# Patient Record
Sex: Female | Born: 1975 | Race: White | Hispanic: No | Marital: Married | Smoking: Never smoker
Health system: Southern US, Community
[De-identification: ages and names within clinical notes are randomized; demographics above are authoritative.]

## PROBLEM LIST (undated history)

## (undated) HISTORY — PX: CHOLECYSTECTOMY: SHX55

---

## 2000-11-21 ENCOUNTER — Other Ambulatory Visit: Admission: RE | Admit: 2000-11-21 | Discharge: 2000-11-21 | Payer: Self-pay | Admitting: Obstetrics and Gynecology

## 2002-07-01 HISTORY — PX: TONSILLECTOMY: SHX5217

## 2003-04-20 ENCOUNTER — Encounter (INDEPENDENT_AMBULATORY_CARE_PROVIDER_SITE_OTHER): Payer: Self-pay | Admitting: Specialist

## 2003-04-20 ENCOUNTER — Ambulatory Visit (HOSPITAL_BASED_OUTPATIENT_CLINIC_OR_DEPARTMENT_OTHER): Admission: RE | Admit: 2003-04-20 | Discharge: 2003-04-20 | Payer: Self-pay | Admitting: Otolaryngology

## 2003-04-20 ENCOUNTER — Ambulatory Visit (HOSPITAL_COMMUNITY): Admission: RE | Admit: 2003-04-20 | Discharge: 2003-04-20 | Payer: Self-pay | Admitting: Otolaryngology

## 2005-03-27 ENCOUNTER — Other Ambulatory Visit: Admission: RE | Admit: 2005-03-27 | Discharge: 2005-03-27 | Payer: Self-pay | Admitting: Obstetrics and Gynecology

## 2006-01-08 ENCOUNTER — Other Ambulatory Visit: Admission: RE | Admit: 2006-01-08 | Discharge: 2006-01-08 | Payer: Self-pay | Admitting: Obstetrics and Gynecology

## 2006-03-20 ENCOUNTER — Ambulatory Visit (HOSPITAL_COMMUNITY): Admission: RE | Admit: 2006-03-20 | Discharge: 2006-03-20 | Payer: Self-pay | Admitting: Obstetrics and Gynecology

## 2006-07-28 ENCOUNTER — Inpatient Hospital Stay (HOSPITAL_COMMUNITY): Admission: AD | Admit: 2006-07-28 | Discharge: 2006-07-30 | Payer: Self-pay | Admitting: Obstetrics and Gynecology

## 2006-09-09 ENCOUNTER — Other Ambulatory Visit: Admission: RE | Admit: 2006-09-09 | Discharge: 2006-09-09 | Payer: Self-pay | Admitting: Obstetrics and Gynecology

## 2007-07-22 ENCOUNTER — Ambulatory Visit (HOSPITAL_COMMUNITY): Admission: RE | Admit: 2007-07-22 | Discharge: 2007-07-22 | Payer: Self-pay | Admitting: Family Medicine

## 2009-01-23 ENCOUNTER — Ambulatory Visit (HOSPITAL_COMMUNITY): Admission: RE | Admit: 2009-01-23 | Discharge: 2009-01-23 | Payer: Self-pay | Admitting: Family Medicine

## 2010-11-16 NOTE — Op Note (Signed)
   NAMELear Ng NO.:  [id number]   MEDICAL RECORD NO.:  [id number]                   PATIENT TYPE:  AMB   LOCATION:  DSC                                  FACILITY:  [facility]   PHYSICIAN:  Suzanna Obey, M.D.                    DATE OF BIRTH:  05-14-1976   DATE OF PROCEDURE:  04/20/2003  DATE OF DISCHARGE:                                 OPERATIVE REPORT   PREOPERATIVE DIAGNOSIS:  Chronic tonsillitis.   POSTOPERATIVE DIAGNOSIS:  Chronic tonsillitis.   SURGICAL PROCEDURE:  Tonsillectomy.   SURGEON:  Suzanna Obey, M.D.   ANESTHESIA:  General endotracheal tube.   ESTIMATED BLOOD LOSS:  Less than 5 mL.   INDICATIONS:  This is a 35 year old who has had repetitive and chronic  problems with tonsillitis.  She is very miserable with this repetitive and  persistent problem.  She is ready to proceed with a tonsillectomy.  She was  informed of the risks and benefits of the procedure including bleeding,  infection, velopharyngeal insufficiency, change in the voice, chronic pain,  and risks of the anesthetic.  All questions are answered and consent was  obtained.   OPERATION:  The patient was taken to the operating room and placed in supine  position and after adequate general endotracheal tube anesthesia, was placed  into the Rose position and draped in the usual sterile manner.  The Crowe-  Davis mouth gag was inserted, retracted and suspended from the [facility] stand.  The left tonsil was begun, making a left anterior tonsillar pillar incision,  identifying the capsule of the tonsil and removing it with electrocautery  dissection; right tonsil was removed in the same fashion.  Hemostasis was  achieved with suction cautery.  The adenoids were checked; there was no  adenoid tissue of significance.  Hypopharynx, esophagus and stomach were  suctioned with the NG tube.  The Crowe-Davis was released and re-suspended  and there was hemostasis present in all  locations.  The patient was awakened  and brought to recovery in stable condition.  Counts correct.                                               Suzanna Obey, M.D.    Cordelia Pen  D:  04/20/2003  T:  04/20/2003  Job:  [id number]   cc:   Lorin Picket A. Gerda Diss, M.D.  [address], [address]  [place]  [place] [address]  Fax: [phone]

## 2010-11-16 NOTE — Discharge Summary (Signed)
[name], [name]              ACCOUNT NO.:  [id number]   MEDICAL RECORD NO.:  [id number]          PATIENT TYPE:  INP   LOCATION:  9102                          FACILITY:  [facility]   PHYSICIAN:  James A. Ashley Royalty, M.D.DATE OF BIRTH:  [birth year]   DATE OF ADMISSION:  07/28/2006  DATE OF DISCHARGE:  07/30/2006                               DISCHARGE SUMMARY   DISCHARGE DIAGNOSIS:  Intrauterine pregnancy at 67 weeks' gestation,  delivered   OPERATIONS AND PROCEDURES:  OB delivery.   CONSULTATIONS:  None.   DISCHARGE MEDICATIONS:  Percocet, Motrin 600 mg.   HISTORY AND PHYSICAL:  A 35 year old gravida 3, para 1-0-1-1 at 39+  weeks' gestation.  Prenatal care was complicated by group B strep  positive and glucose intolerance.  The patient requested induction  secondary to musculoskeletal discomfort.  Initial cervical examination  revealed the cervix to be 3-4 cm dilated, 75-80% effaced, 0-1+, station,  and vertex presentation.  For the remainder of the history and physical,  please see chart.   HOSPITAL COURSE:  The patient was admitted was admitted to [facility].  Admission laboratory studies were drawn.  She  was given Pitocin.  She went on to labor and deliver on July 28, 2006.  The infant was an 8 pounds 0 ounce female, Apgars 8 at one minute  9 at five minutes, sent to newborn nursery.  Delivery was accomplished  by Dr. Sylvester Harder over an intact perineum.  There were no  lacerations.  The patient's postpartum course was benign.  She was  discharged on the second postpartum day afebrile and in satisfactory  condition.   DISPOSITION:  The patient is to return to [facility] and  Obstetrics in 4-6 weeks for postpartum evaluation.      James A. Ashley Royalty, M.D.  Electronically Signed     JAM/MEDQ  D:  09/29/2006  T:  09/29/2006  Job:  [id number]

## 2011-05-20 IMAGING — CR DG ANKLE COMPLETE 3+V*R*
2 series · 2 of 2 positions shown · non-contrast
Comparison: None

CLINICAL DATA: Lateral pain following fall

RIGHT ANKLE - COMPLETE 3+ VIEW

[view not recorded (1 of 2)]
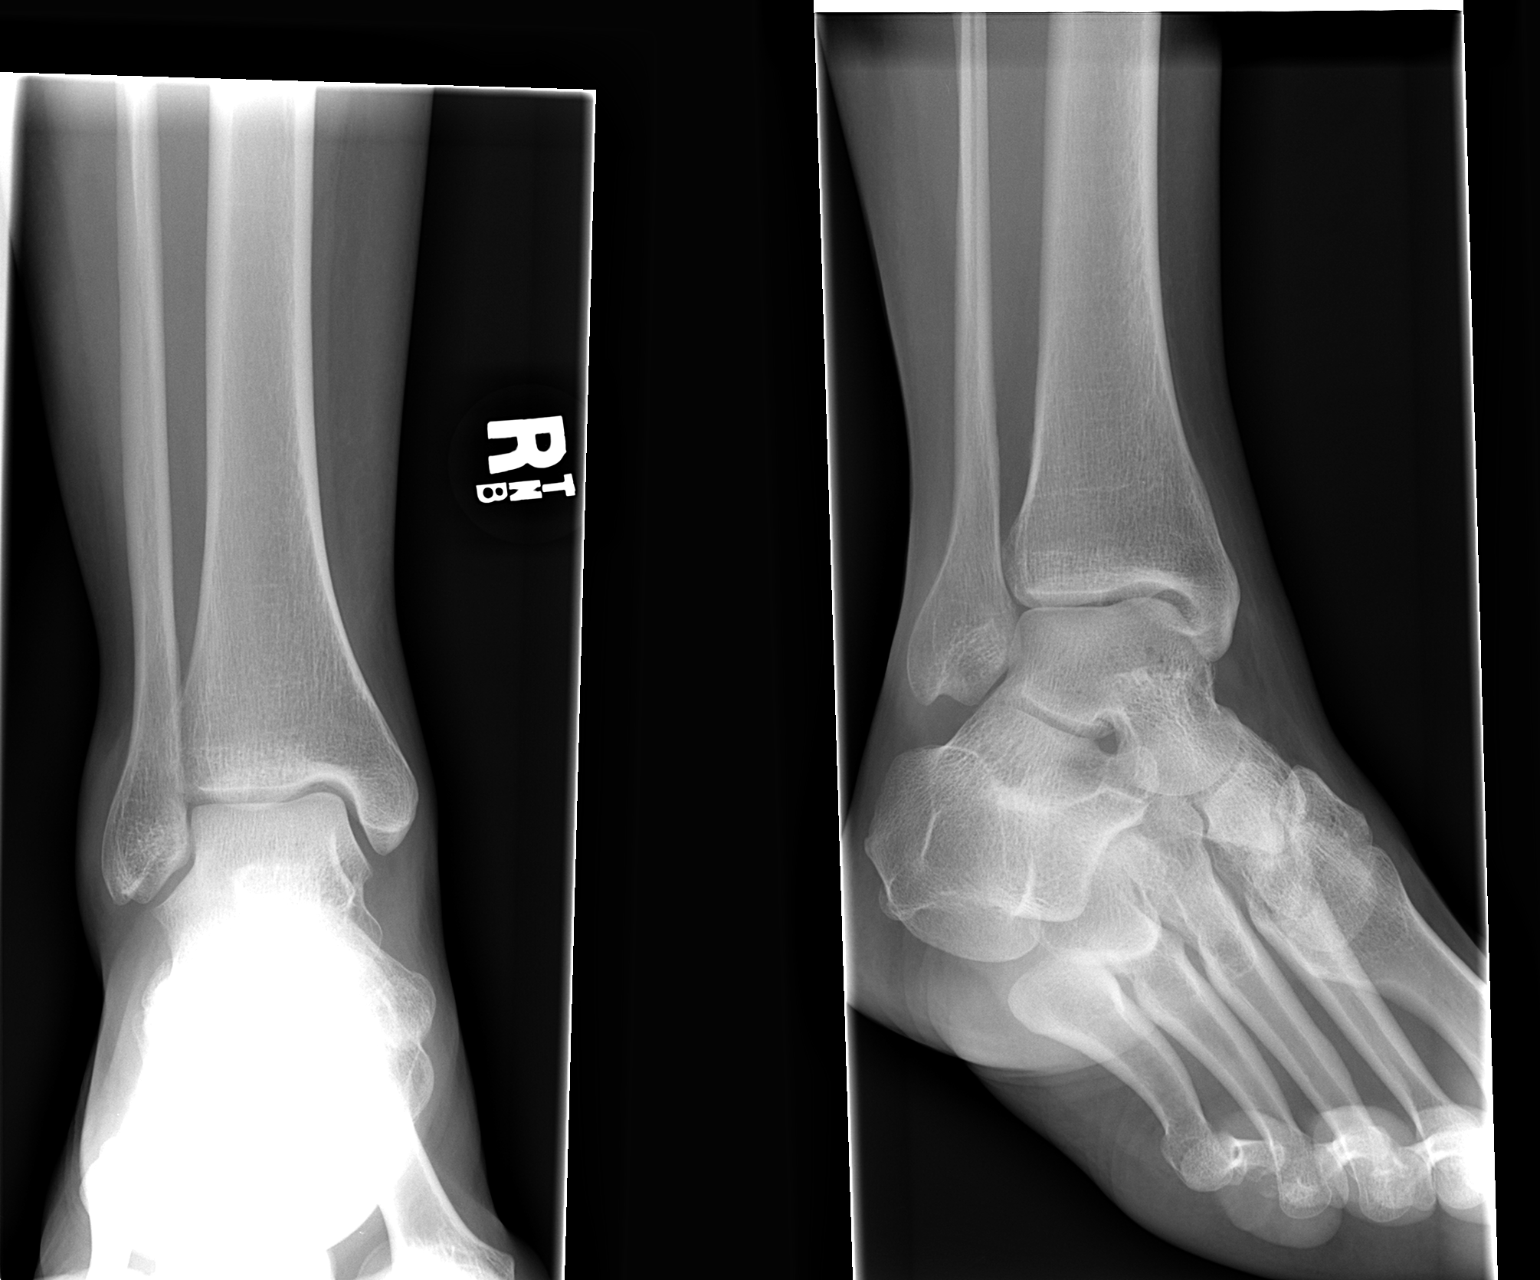

[view not recorded (2 of 2)]
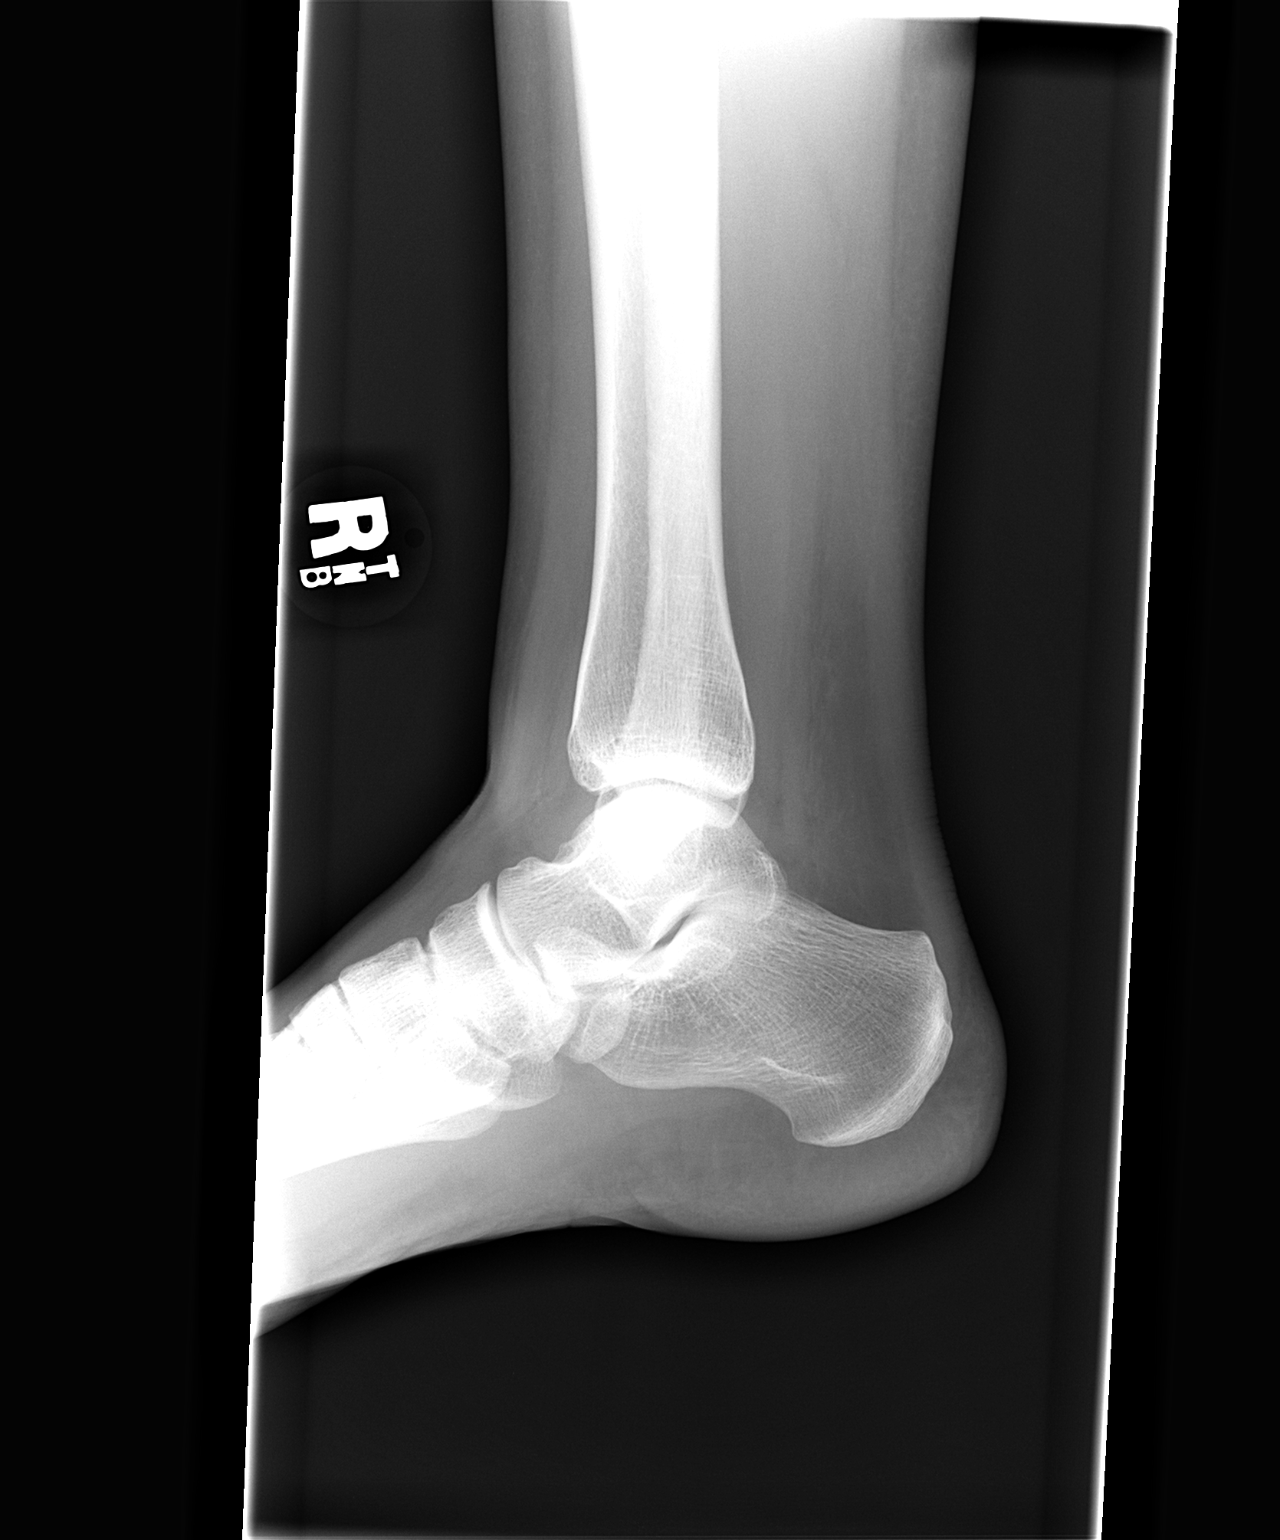

[2 of 2 positions shown; findings below may reference images not displayed]

FINDINGS: Mild lateral soft tissue swelling.  No fracture,
dislocation, or acute changes.
IMPRESSION: Mild lateral soft tissue swelling - otherwise unremarkable.

## 2012-09-24 ENCOUNTER — Other Ambulatory Visit: Payer: Self-pay | Admitting: Obstetrics and Gynecology

## 2013-07-20 ENCOUNTER — Telehealth: Payer: Self-pay | Admitting: Nurse Practitioner

## 2013-07-20 MED ORDER — OSELTAMIVIR PHOSPHATE 75 MG PO CAPS: 75.0000 mg | ORAL_CAPSULE | Freq: Two times a day (BID) | ORAL | Status: DC

## 2013-07-20 NOTE — Telephone Encounter (Signed)
Rx sent electronically to [facility]. Patient notified.

## 2013-07-20 NOTE — Telephone Encounter (Signed)
Patients daughter was just diagnosed with the flu at her pediatric office in [place]. Patient is currently having bad headache, fever and dry cough. Would like Tami-flu called in if possible. [facility] [place]

## 2013-07-20 NOTE — Telephone Encounter (Signed)
tamiflu 75 bid five d 

## 2013-10-20 ENCOUNTER — Other Ambulatory Visit: Payer: Self-pay | Admitting: Obstetrics and Gynecology

## 2014-11-04 ENCOUNTER — Other Ambulatory Visit (HOSPITAL_COMMUNITY): Payer: Self-pay | Admitting: Obstetrics and Gynecology

## 2014-11-07 LAB — CYTOLOGY - PAP

## 2015-12-11 ENCOUNTER — Other Ambulatory Visit: Payer: Self-pay | Admitting: Obstetrics & Gynecology

## 2015-12-12 LAB — CYTOLOGY - PAP

## 2016-01-03 ENCOUNTER — Other Ambulatory Visit: Payer: Self-pay | Admitting: Obstetrics & Gynecology

## 2016-10-07 DIAGNOSIS — Z6820 Body mass index (BMI) 20.0-20.9, adult: Secondary | ICD-10-CM | POA: Diagnosis not present

## 2016-12-06 DIAGNOSIS — X32XXXA Exposure to sunlight, initial encounter: Secondary | ICD-10-CM | POA: Diagnosis not present

## 2016-12-06 DIAGNOSIS — D225 Melanocytic nevi of trunk: Secondary | ICD-10-CM | POA: Diagnosis not present

## 2016-12-06 DIAGNOSIS — L570 Actinic keratosis: Secondary | ICD-10-CM | POA: Diagnosis not present

## 2016-12-20 DIAGNOSIS — Z6820 Body mass index (BMI) 20.0-20.9, adult: Secondary | ICD-10-CM | POA: Diagnosis not present

## 2016-12-20 DIAGNOSIS — Z87891 Personal history of nicotine dependence: Secondary | ICD-10-CM | POA: Diagnosis not present

## 2017-01-31 DIAGNOSIS — Z01419 Encounter for gynecological examination (general) (routine) without abnormal findings: Secondary | ICD-10-CM | POA: Diagnosis not present

## 2017-01-31 DIAGNOSIS — Z6821 Body mass index (BMI) 21.0-21.9, adult: Secondary | ICD-10-CM | POA: Diagnosis not present

## 2017-01-31 DIAGNOSIS — Z124 Encounter for screening for malignant neoplasm of cervix: Secondary | ICD-10-CM | POA: Diagnosis not present

## 2017-08-08 DIAGNOSIS — Z6821 Body mass index (BMI) 21.0-21.9, adult: Secondary | ICD-10-CM | POA: Diagnosis not present

## 2017-10-21 DIAGNOSIS — Z3202 Encounter for pregnancy test, result negative: Secondary | ICD-10-CM | POA: Diagnosis not present

## 2017-11-26 DIAGNOSIS — Z6821 Body mass index (BMI) 21.0-21.9, adult: Secondary | ICD-10-CM | POA: Diagnosis not present

## 2018-02-25 DIAGNOSIS — Z6821 Body mass index (BMI) 21.0-21.9, adult: Secondary | ICD-10-CM | POA: Diagnosis not present

## 2018-04-15 DIAGNOSIS — Z01419 Encounter for gynecological examination (general) (routine) without abnormal findings: Secondary | ICD-10-CM | POA: Diagnosis not present

## 2018-04-15 DIAGNOSIS — Z1231 Encounter for screening mammogram for malignant neoplasm of breast: Secondary | ICD-10-CM | POA: Diagnosis not present

## 2018-04-17 ENCOUNTER — Other Ambulatory Visit: Payer: Self-pay | Admitting: Obstetrics

## 2018-04-17 DIAGNOSIS — R928 Other abnormal and inconclusive findings on diagnostic imaging of breast: Secondary | ICD-10-CM

## 2018-04-20 ENCOUNTER — Other Ambulatory Visit: Payer: Self-pay

## 2018-04-20 ENCOUNTER — Ambulatory Visit
Admission: RE | Admit: 2018-04-20 | Discharge: 2018-04-20 | Disposition: A | Discharge status: Home / Self Care | Payer: 59 | Source: Ambulatory Visit | Attending: Obstetrics | Admitting: Obstetrics

## 2018-04-20 DIAGNOSIS — R928 Other abnormal and inconclusive findings on diagnostic imaging of breast: Secondary | ICD-10-CM

## 2018-04-20 DIAGNOSIS — N6011 Diffuse cystic mastopathy of right breast: Secondary | ICD-10-CM | POA: Diagnosis not present

## 2018-04-20 DIAGNOSIS — R922 Inconclusive mammogram: Secondary | ICD-10-CM | POA: Diagnosis not present

## 2018-06-03 DIAGNOSIS — Z6822 Body mass index (BMI) 22.0-22.9, adult: Secondary | ICD-10-CM | POA: Diagnosis not present

## 2018-06-03 DIAGNOSIS — G4701 Insomnia due to medical condition: Secondary | ICD-10-CM | POA: Diagnosis not present

## 2019-05-20 DIAGNOSIS — F909 Attention-deficit hyperactivity disorder, unspecified type: Secondary | ICD-10-CM | POA: Diagnosis not present

## 2019-05-20 DIAGNOSIS — G4709 Other insomnia: Secondary | ICD-10-CM | POA: Diagnosis not present

## 2019-05-21 ENCOUNTER — Other Ambulatory Visit: Payer: Self-pay

## 2019-05-21 DIAGNOSIS — Z20822 Contact with and (suspected) exposure to covid-19: Secondary | ICD-10-CM

## 2019-05-23 LAB — NOVEL CORONAVIRUS, NAA: SARS-CoV-2, NAA: NOT DETECTED

## 2019-09-02 DIAGNOSIS — F909 Attention-deficit hyperactivity disorder, unspecified type: Secondary | ICD-10-CM | POA: Diagnosis not present

## 2019-09-09 DIAGNOSIS — Z1231 Encounter for screening mammogram for malignant neoplasm of breast: Secondary | ICD-10-CM | POA: Diagnosis not present

## 2019-09-09 DIAGNOSIS — N870 Mild cervical dysplasia: Secondary | ICD-10-CM | POA: Diagnosis not present

## 2019-09-09 DIAGNOSIS — Z6824 Body mass index (BMI) 24.0-24.9, adult: Secondary | ICD-10-CM | POA: Diagnosis not present

## 2019-09-09 DIAGNOSIS — Z01419 Encounter for gynecological examination (general) (routine) without abnormal findings: Secondary | ICD-10-CM | POA: Diagnosis not present

## 2019-12-02 DIAGNOSIS — Z20822 Contact with and (suspected) exposure to covid-19: Secondary | ICD-10-CM | POA: Diagnosis not present

## 2019-12-02 DIAGNOSIS — Z03818 Encounter for observation for suspected exposure to other biological agents ruled out: Secondary | ICD-10-CM | POA: Diagnosis not present

## 2019-12-07 DIAGNOSIS — F909 Attention-deficit hyperactivity disorder, unspecified type: Secondary | ICD-10-CM | POA: Diagnosis not present

## 2019-12-07 DIAGNOSIS — Z6823 Body mass index (BMI) 23.0-23.9, adult: Secondary | ICD-10-CM | POA: Diagnosis not present

## 2019-12-07 DIAGNOSIS — Z1389 Encounter for screening for other disorder: Secondary | ICD-10-CM | POA: Diagnosis not present

## 2020-03-21 DIAGNOSIS — F909 Attention-deficit hyperactivity disorder, unspecified type: Secondary | ICD-10-CM | POA: Diagnosis not present

## 2020-08-14 IMAGING — MG DIGITAL DIAGNOSTIC UNILATERAL RIGHT MAMMOGRAM WITH TOMO AND CAD
4 series · 4 of 12 positions shown · non-contrast
Comparison: Previous exam(s).

CLINICAL DATA: Screening recall for masses in the right breast.

EXAM:
DIGITAL DIAGNOSTIC RIGHT MAMMOGRAM WITH CAD AND TOMO
ULTRASOUND RIGHT BREAST

[R MLO synth-2D]
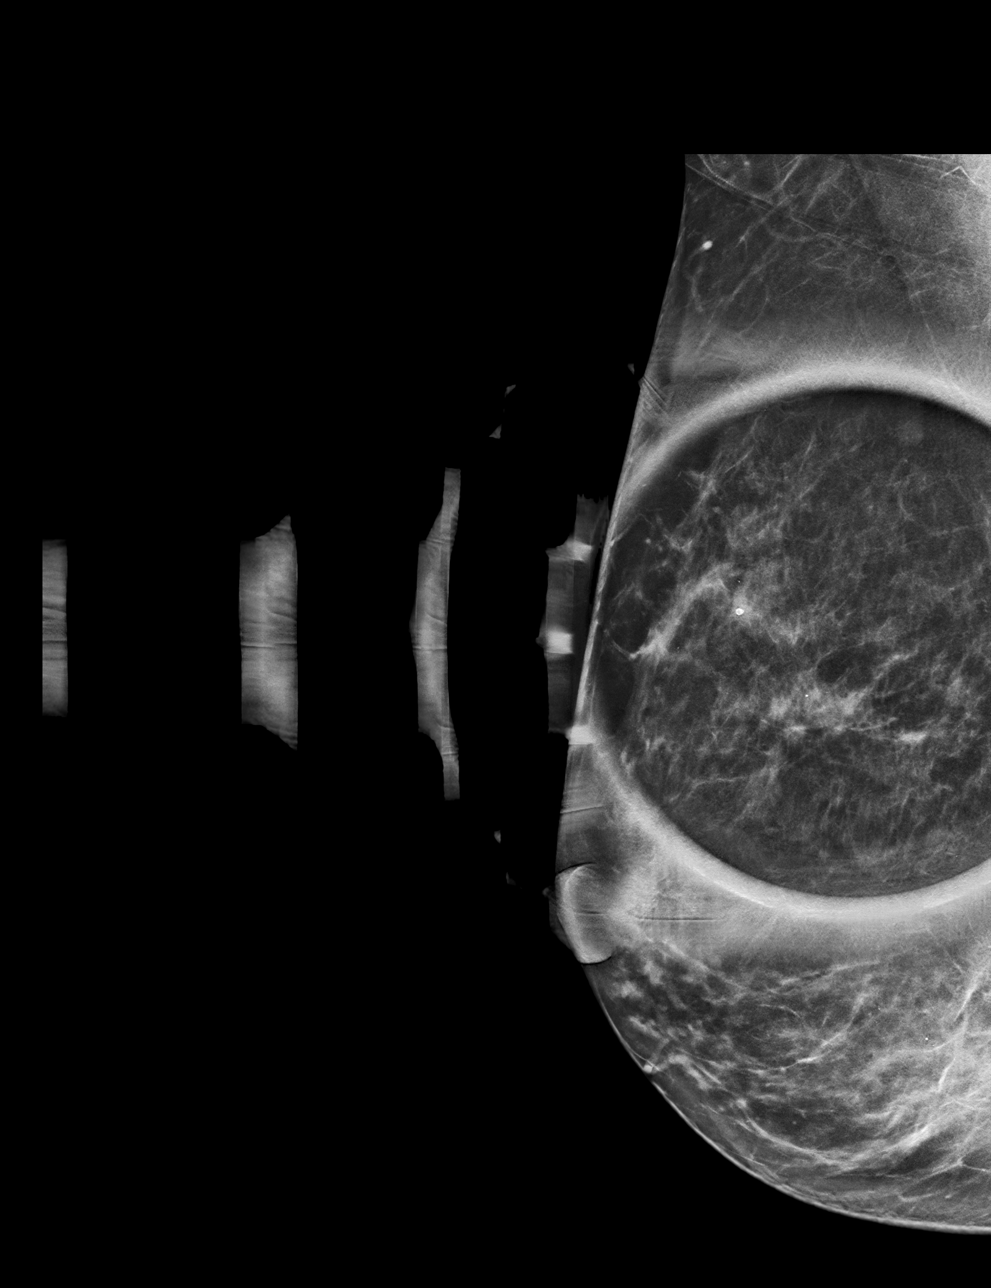

[R CC synth-2D]
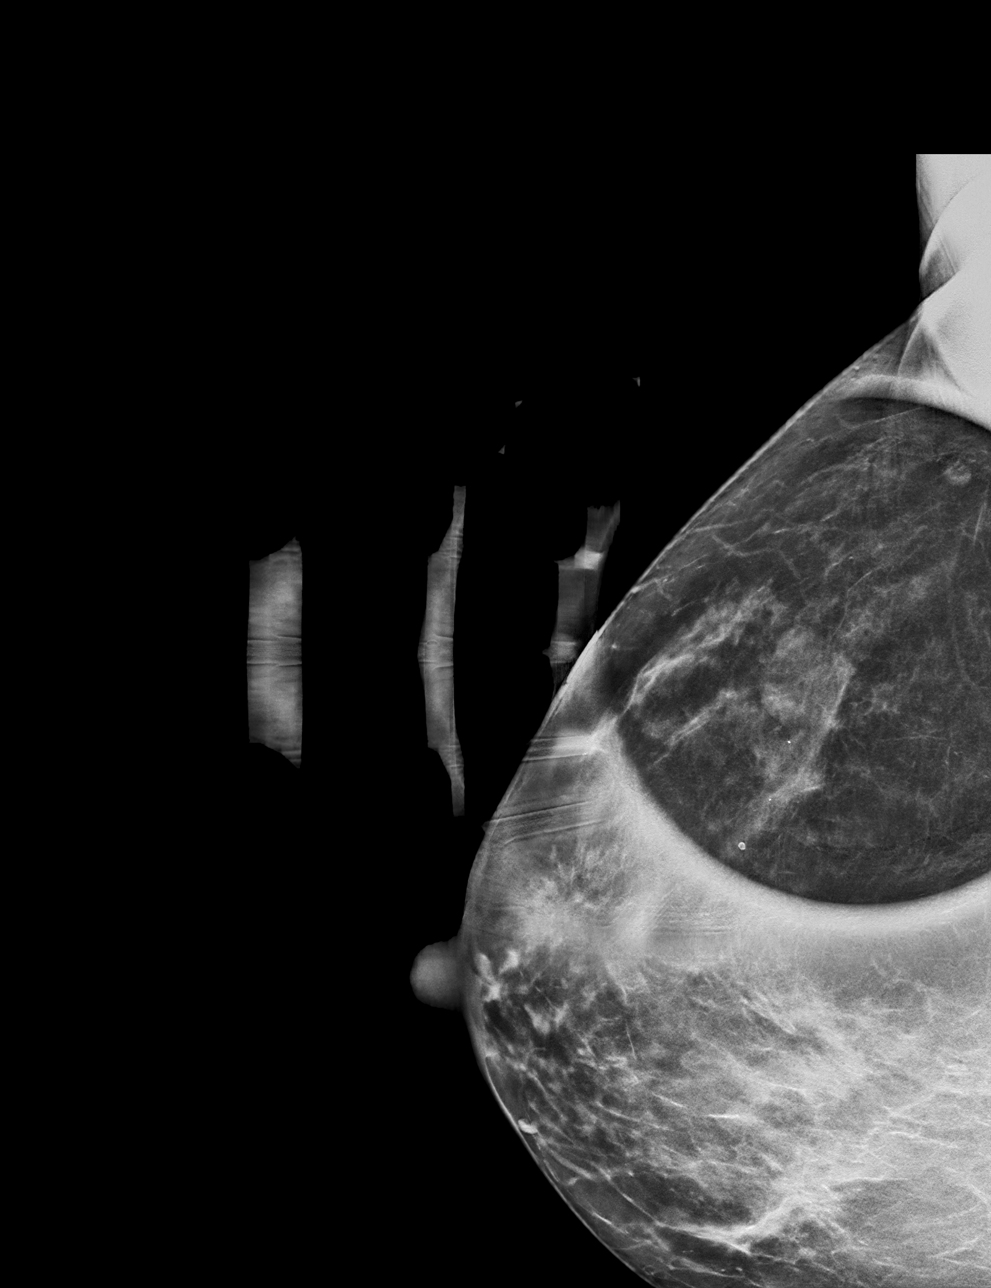

[R MLO tomo · tomo slice 32/63.0]
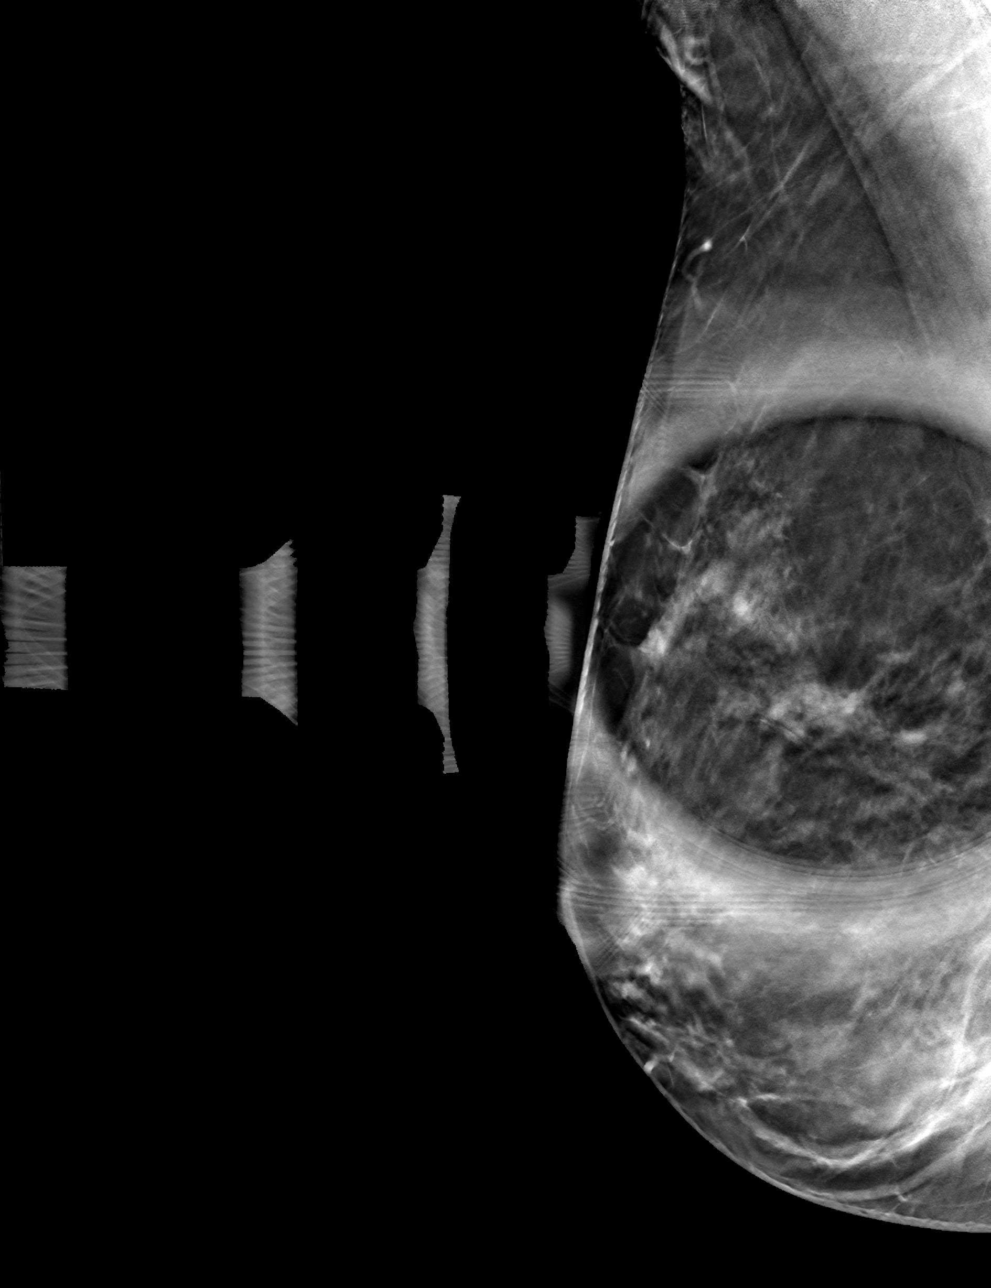

[R CC tomo · tomo slice 32/63.0]
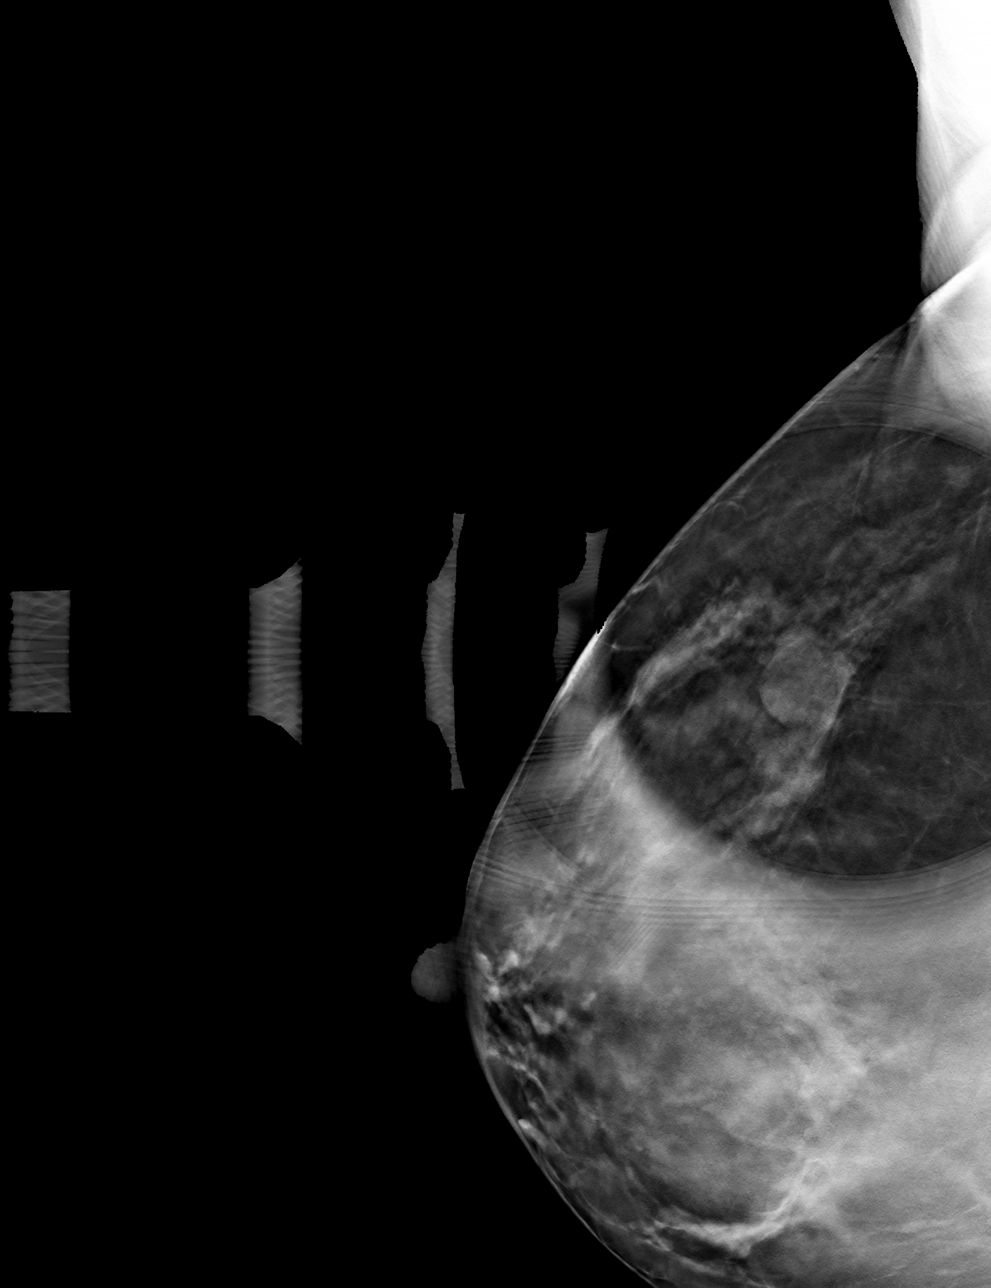

[4 of 12 positions shown; findings below may reference images not displayed]

ACR Breast Density Category c: The breast tissue is heterogeneously
dense, which may obscure small masses.
FINDINGS: Spot compression tomosynthesis images of the upper-outer right
breast view reveals 3 and possibly 4 masses in the upper outer right
breast, 2 which are oval and circumscribed and 1-1 of which are
obscured.

Mammographic images were processed with CAD.

On physical exam, a palpable nodular area can be felt in the
upper-outer quadrant of the right breast.

Targeted ultrasound is performed, showing multiple benign cysts in
the upper-outer right breast between 10 and 11 o'clock. The largest
is at 10 o'clock, 4 cm from the nipple measuring 1.4 x 0.9 x 1.3 cm.
No suspicious masses or areas of shadowing are identified.
IMPRESSION: The masses identified in the upper-outer right breast correspond
with multiple benign cysts.

RECOMMENDATION:
Screening mammogram in one year.(Code:1X-8-60D)

I have discussed the findings and recommendations with the patient.
Results were also provided in writing at the conclusion of the
visit. If applicable, a reminder letter will be sent to the patient
regarding the next appointment.

BI-RADS CATEGORY  2: Benign.

## 2020-08-14 IMAGING — US ULTRASOUND RIGHT BREAST LIMITED
1 series · 7 of 7 positions shown · non-contrast
Comparison: Previous exam(s).

CLINICAL DATA: Screening recall for masses in the right breast.

EXAM:
DIGITAL DIAGNOSTIC RIGHT MAMMOGRAM WITH CAD AND TOMO
ULTRASOUND RIGHT BREAST

[Series 1: ultrasound right breast limited · 0.06mm/px · 7 of 7 slices shown]
[im 1/7]
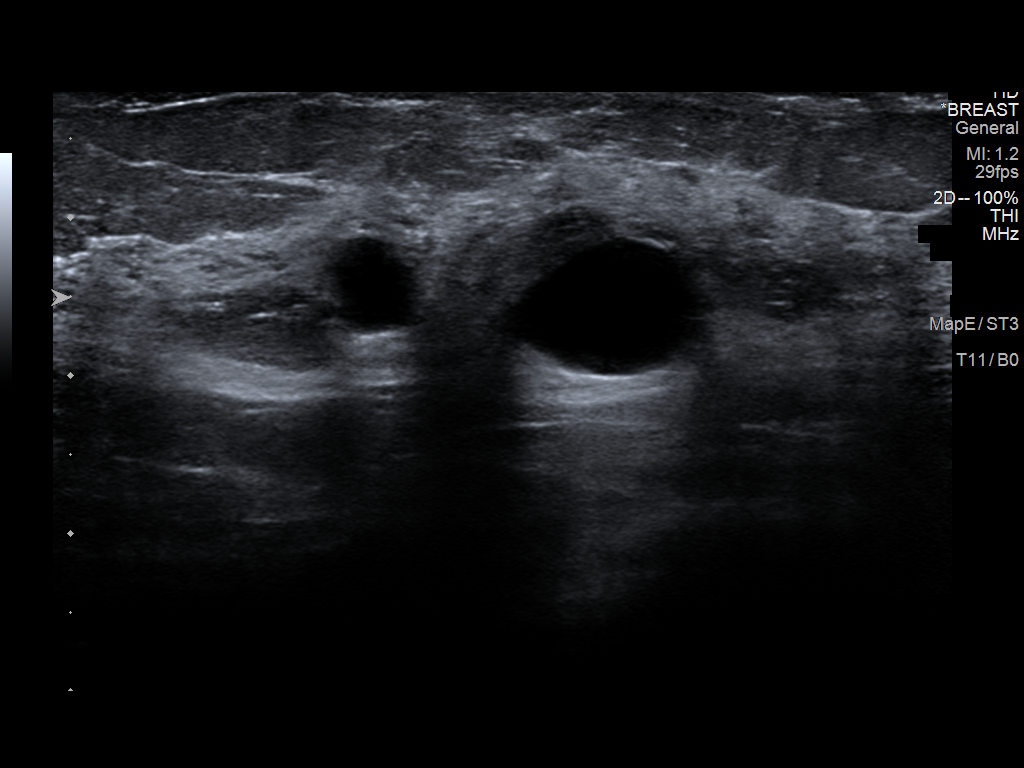
[im 2/7]
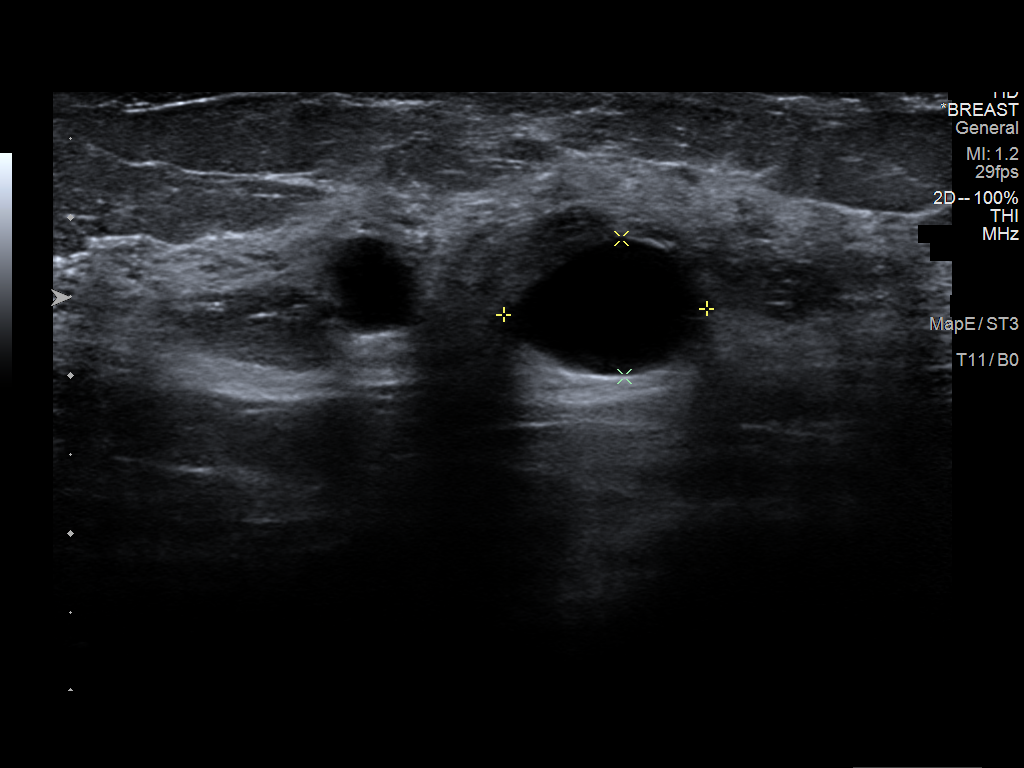
[im 3/7]
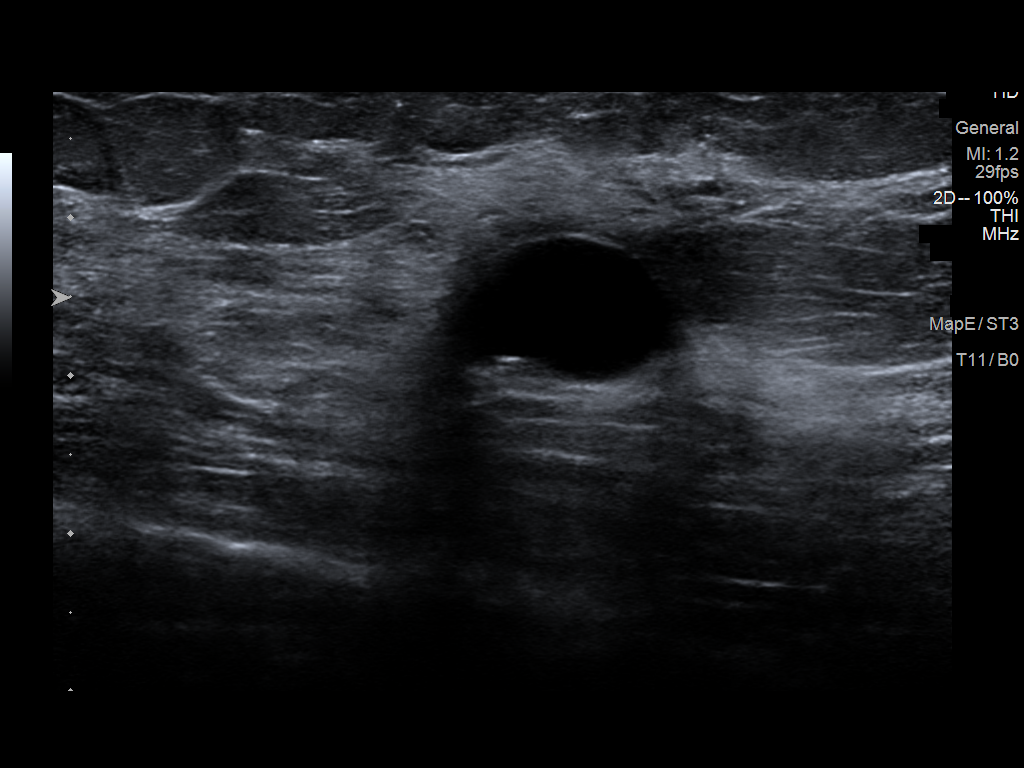
[im 4/7]
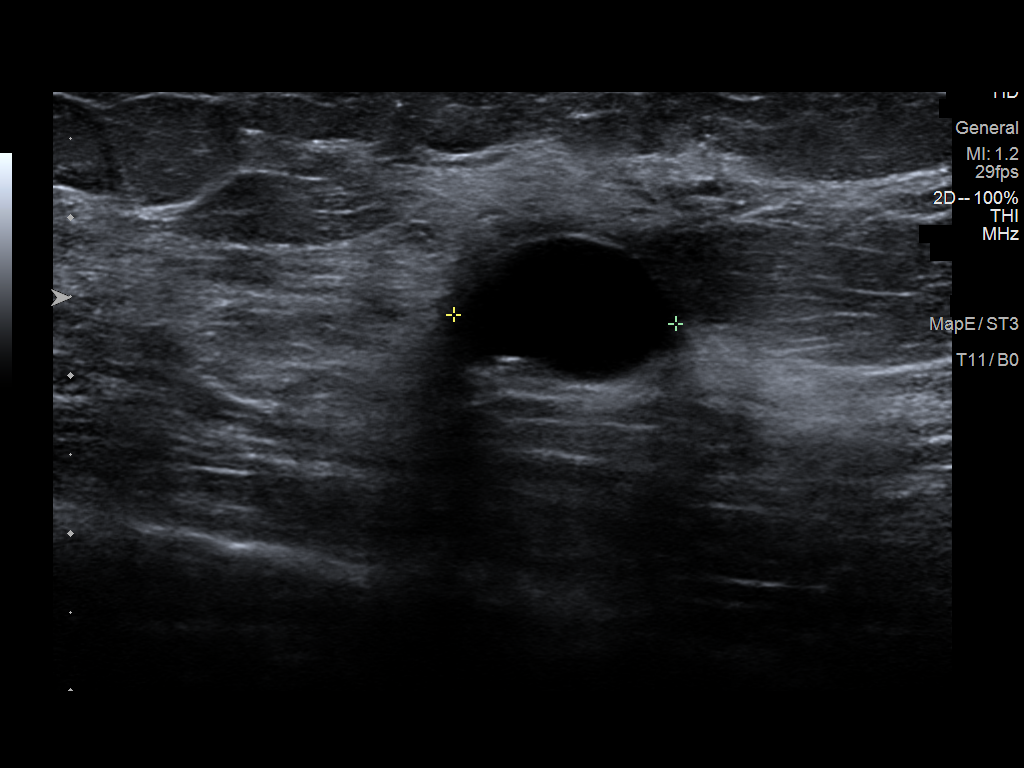
[im 5/7]
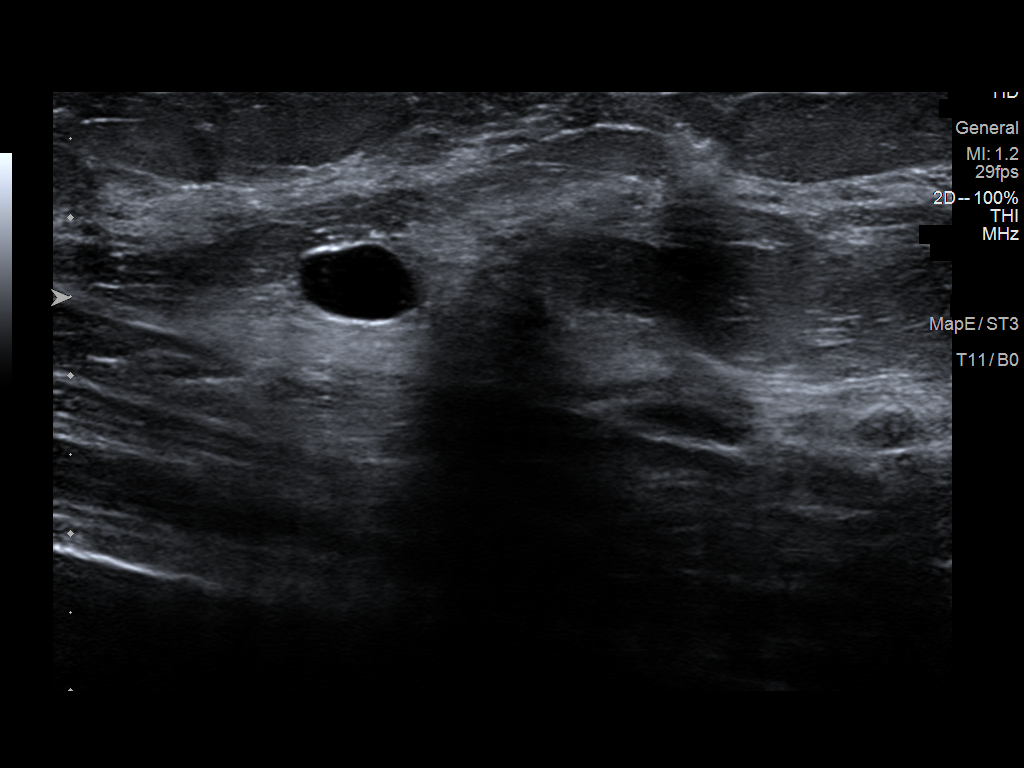
[im 6/7]
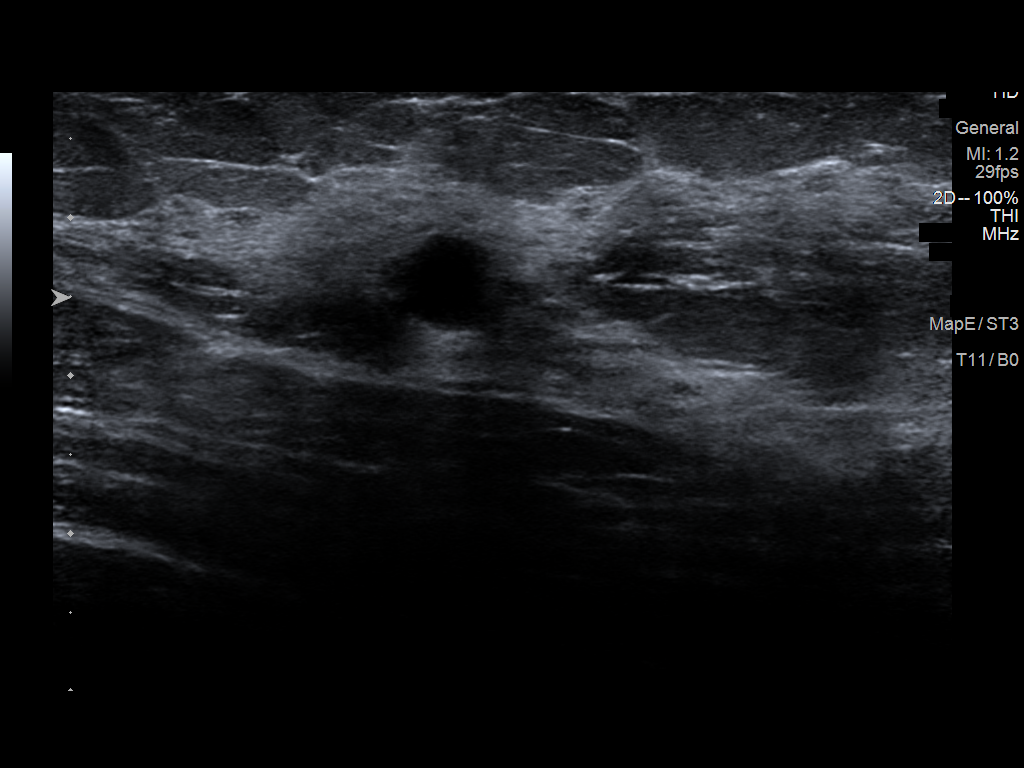
[im 7/7]
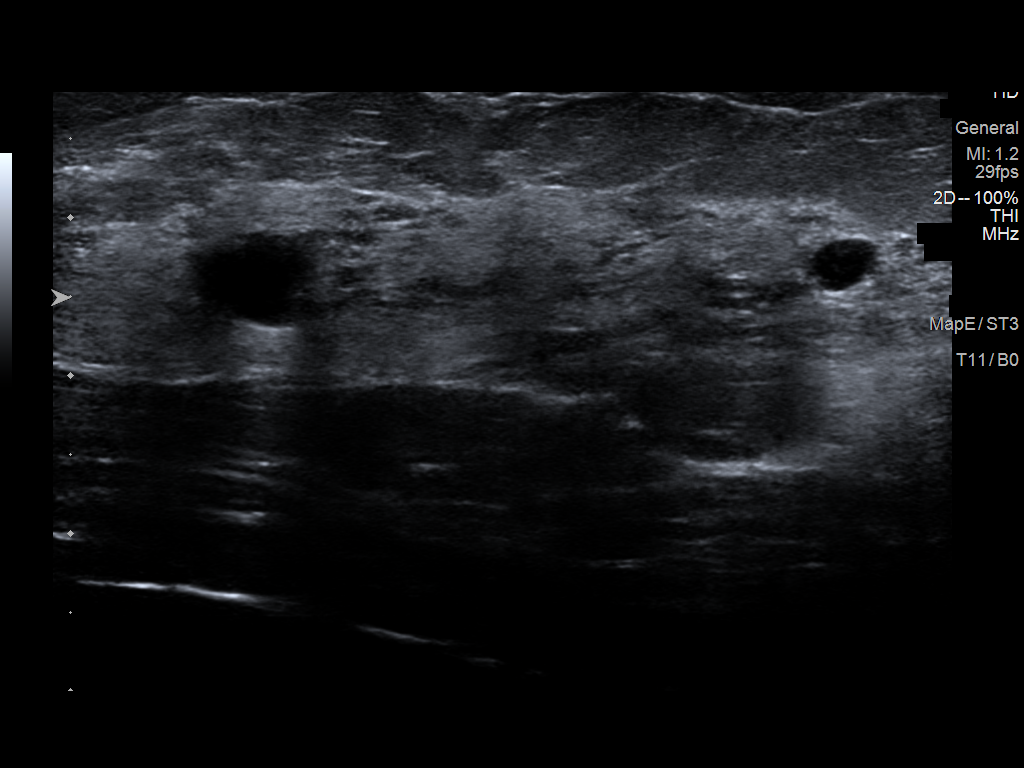

[7 of 7 positions shown; findings below may reference images not displayed]

ACR Breast Density Category c: The breast tissue is heterogeneously
dense, which may obscure small masses.
FINDINGS: Spot compression tomosynthesis images of the upper-outer right
breast view reveals 3 and possibly 4 masses in the upper outer right
breast, 2 which are oval and circumscribed and 1-1 of which are
obscured.

Mammographic images were processed with CAD.

On physical exam, a palpable nodular area can be felt in the
upper-outer quadrant of the right breast.

Targeted ultrasound is performed, showing multiple benign cysts in
the upper-outer right breast between 10 and 11 o'clock. The largest
is at 10 o'clock, 4 cm from the nipple measuring 1.4 x 0.9 x 1.3 cm.
No suspicious masses or areas of shadowing are identified.
IMPRESSION: The masses identified in the upper-outer right breast correspond
with multiple benign cysts.

RECOMMENDATION:
Screening mammogram in one year.(Code:1X-8-60D)

I have discussed the findings and recommendations with the patient.
Results were also provided in writing at the conclusion of the
visit. If applicable, a reminder letter will be sent to the patient
regarding the next appointment.

BI-RADS CATEGORY  2: Benign.

## 2020-10-11 DIAGNOSIS — R519 Headache, unspecified: Secondary | ICD-10-CM | POA: Diagnosis not present

## 2020-10-11 DIAGNOSIS — F909 Attention-deficit hyperactivity disorder, unspecified type: Secondary | ICD-10-CM | POA: Diagnosis not present

## 2021-01-22 DIAGNOSIS — E6609 Other obesity due to excess calories: Secondary | ICD-10-CM | POA: Diagnosis not present

## 2021-01-22 DIAGNOSIS — N926 Irregular menstruation, unspecified: Secondary | ICD-10-CM | POA: Diagnosis not present

## 2021-01-22 DIAGNOSIS — R5382 Chronic fatigue, unspecified: Secondary | ICD-10-CM | POA: Diagnosis not present

## 2021-01-22 DIAGNOSIS — L658 Other specified nonscarring hair loss: Secondary | ICD-10-CM | POA: Diagnosis not present

## 2021-01-22 DIAGNOSIS — E663 Overweight: Secondary | ICD-10-CM | POA: Diagnosis not present

## 2021-03-16 DIAGNOSIS — Z01419 Encounter for gynecological examination (general) (routine) without abnormal findings: Secondary | ICD-10-CM | POA: Diagnosis not present

## 2021-03-16 DIAGNOSIS — Z1231 Encounter for screening mammogram for malignant neoplasm of breast: Secondary | ICD-10-CM | POA: Diagnosis not present

## 2021-03-16 DIAGNOSIS — Z6822 Body mass index (BMI) 22.0-22.9, adult: Secondary | ICD-10-CM | POA: Diagnosis not present

## 2021-03-19 DIAGNOSIS — G4709 Other insomnia: Secondary | ICD-10-CM | POA: Diagnosis not present

## 2021-03-19 DIAGNOSIS — Z6821 Body mass index (BMI) 21.0-21.9, adult: Secondary | ICD-10-CM | POA: Diagnosis not present

## 2021-03-19 DIAGNOSIS — F909 Attention-deficit hyperactivity disorder, unspecified type: Secondary | ICD-10-CM | POA: Diagnosis not present

## 2021-03-19 DIAGNOSIS — Z0000 Encounter for general adult medical examination without abnormal findings: Secondary | ICD-10-CM | POA: Diagnosis not present

## 2021-03-19 DIAGNOSIS — Z1331 Encounter for screening for depression: Secondary | ICD-10-CM | POA: Diagnosis not present

## 2021-08-07 ENCOUNTER — Encounter: Payer: Self-pay | Admitting: Gastroenterology

## 2021-08-10 ENCOUNTER — Telehealth: Payer: Self-pay | Admitting: *Deleted

## 2021-08-10 NOTE — Telephone Encounter (Signed)
Called pt. At scheduled time x2 and message left to call back to reschedule pre-visit by 5 pm today  or procedure will be cancelled no return call received,procedure cancelled and no show letter sent.

## 2021-08-24 ENCOUNTER — Encounter: Payer: 59 | Admitting: Gastroenterology

## 2023-06-18 ENCOUNTER — Emergency Department (HOSPITAL_BASED_OUTPATIENT_CLINIC_OR_DEPARTMENT_OTHER): Payer: 59

## 2023-06-18 ENCOUNTER — Encounter (HOSPITAL_BASED_OUTPATIENT_CLINIC_OR_DEPARTMENT_OTHER): Payer: Self-pay | Admitting: Emergency Medicine

## 2023-06-18 ENCOUNTER — Other Ambulatory Visit: Payer: Self-pay

## 2023-06-18 ENCOUNTER — Emergency Department (HOSPITAL_BASED_OUTPATIENT_CLINIC_OR_DEPARTMENT_OTHER)
Admission: EM | Admit: 2023-06-18 | Discharge: 2023-06-18 | Disposition: A | Discharge status: Home / Self Care | Payer: 59 | Attending: Emergency Medicine | Admitting: Emergency Medicine

## 2023-06-18 DIAGNOSIS — K429 Umbilical hernia without obstruction or gangrene: Secondary | ICD-10-CM | POA: Diagnosis not present

## 2023-06-18 DIAGNOSIS — R110 Nausea: Secondary | ICD-10-CM | POA: Diagnosis present

## 2023-06-18 LAB — CBC WITH DIFFERENTIAL/PLATELET
Abs Immature Granulocytes: 0.02 10*3/uL (ref 0.00–0.07)
Basophils Absolute: 0 10*3/uL (ref 0.0–0.1)
Basophils Relative: 1 %
Eosinophils Absolute: 0.1 10*3/uL (ref 0.0–0.5)
Eosinophils Relative: 2 %
HCT: 42.7 % (ref 36.0–46.0)
Hemoglobin: 14.8 g/dL (ref 12.0–15.0)
Immature Granulocytes: 0 %
Lymphocytes Relative: 23 %
Lymphs Abs: 1.7 10*3/uL (ref 0.7–4.0)
MCH: 31.4 pg (ref 26.0–34.0)
MCHC: 34.7 g/dL (ref 30.0–36.0)
MCV: 90.5 fL (ref 80.0–100.0)
Monocytes Absolute: 0.6 10*3/uL (ref 0.1–1.0)
Monocytes Relative: 7 %
Neutro Abs: 5 10*3/uL (ref 1.7–7.7)
Neutrophils Relative %: 67 %
Platelets: 263 10*3/uL (ref 150–400)
RBC: 4.72 MIL/uL (ref 3.87–5.11)
RDW: 12.8 % (ref 11.5–15.5)
WBC: 7.5 10*3/uL (ref 4.0–10.5)
nRBC: 0 % (ref 0.0–0.2)

## 2023-06-18 LAB — BASIC METABOLIC PANEL
Anion gap: 8 (ref 5–15)
BUN: 6 mg/dL (ref 6–20)
CO2: 27 mmol/L (ref 22–32)
Calcium: 8.7 mg/dL — ABNORMAL LOW (ref 8.9–10.3)
Chloride: 102 mmol/L (ref 98–111)
Creatinine, Ser: 0.75 mg/dL (ref 0.44–1.00)
GFR, Estimated: >60 mL/min (ref >60)
Glucose, Bld: 80 mg/dL (ref 70–99)
Potassium: 3.2 mmol/L — ABNORMAL LOW (ref 3.5–5.1)
Sodium: 137 mmol/L (ref 135–145)

## 2023-06-18 LAB — PREGNANCY, URINE: Preg Test, Ur: NEGATIVE

## 2023-06-18 MED ORDER — HYDROMORPHONE HCL 1 MG/ML IJ SOLN
0.5000 mg | Freq: Once | INTRAMUSCULAR | Status: AC
  Administered 2023-06-18: 0.5 mg via INTRAVENOUS
  Filled 2023-06-18: qty 1

## 2023-06-18 MED ORDER — HYDROCODONE-ACETAMINOPHEN 5-325 MG PO TABS: 1.0000 | ORAL_TABLET | Freq: Four times a day (QID) | ORAL | 0 refills | Status: DC | PRN

## 2023-06-18 MED ORDER — IOHEXOL 300 MG/ML  SOLN
100.0000 mL | Freq: Once | INTRAMUSCULAR | Status: AC | PRN
  Administered 2023-06-18: 85 mL via INTRAVENOUS

## 2023-06-18 MED ORDER — ONDANSETRON HCL 4 MG/2ML IJ SOLN
4.0000 mg | Freq: Once | INTRAMUSCULAR | Status: AC
  Administered 2023-06-18: 4 mg via INTRAVENOUS
  Filled 2023-06-18: qty 2

## 2023-06-18 NOTE — Discharge Instructions (Signed)
Contact general surgery for follow-up.  Can use anybody that you want to provide you the name of the person on-call.  Pick up your pain medication at the [facility] tomorrow when they are open.  Take as directed.  Can also take Aleve 2 tablets every 12 hours.  And if not taking the pain medicine can take extra strength Tylenol 2 tablets every 8 hours.  But the pain medicine does have Tylenol in it so you cannot take Tylenol along with the pain medicine.  Return for any worse pain.  Particularly any nausea or vomiting could be a sign of bowel obstruction.  As we discussed CT scan here today showed just fat entrapment in the area no evidence of any bowel entrapment.  This fat will not cause any serious problems but it does cause pain.  However you do need this hernia repaired.

## 2023-06-18 NOTE — ED Triage Notes (Signed)
Hx hernia. After working out last night hernia was bulging and painful. Not able to "put it back in"

## 2023-06-18 NOTE — ED Provider Notes (Addendum)
[place] EMERGENCY DEPARTMENT AT [facility] Provider Note   CSN: [id number] Arrival date & time: 06/18/23  1641     History  Chief Complaint  Patient presents with   Hernia    Laura [name] is a 47 y.o. female.  Patient has had a bulge just superior to her umbilicus that has occurred on and off over the years.  Her OB/GYN told her long time ago that it was probably an umbilical hernia.  Patient was given referral but never followed up.  Really had not been a problem.  Usually would come and go.  Patient for the last 23 hours it has been out and she has not been able to get it back and it is kind of painful.  Associated with some nausea but no vomiting. Patient surgical history is only significant for cholecystectomy.         Home Medications Prior to Admission medications   Medication Sig Start Date End Date Taking? Authorizing Provider  HYDROcodone-acetaminophen (NORCO/VICODIN) 5-325 MG tablet Take 1 tablet by mouth every 6 (six) hours as needed for moderate pain (pain score 4-6). 06/18/23  Yes Vanetta Mulders, MD  oseltamivir (TAMIFLU) 75 MG capsule Take 1 capsule (75 mg total) by mouth 2 (two) times daily. 07/20/13   Merlyn Albert, MD      Allergies    Penicillins    Review of Systems   Review of Systems  Constitutional:  Negative for chills and fever.  HENT:  Negative for ear pain and sore throat.   Eyes:  Negative for pain and visual disturbance.  Respiratory:  Negative for cough and shortness of breath.   Cardiovascular:  Negative for chest pain and palpitations.  Gastrointestinal:  Positive for abdominal pain and nausea. Negative for vomiting.  Genitourinary:  Negative for dysuria and hematuria.  Musculoskeletal:  Negative for arthralgias and back pain.  Skin:  Negative for color change and rash.  Neurological:  Negative for seizures and syncope.  All other systems reviewed and are negative.   Physical Exam Updated Vital Signs BP 104/75  (BP Location: Right Arm)   Pulse 67   Temp 98.8 F (37.1 C) (Oral)   Resp 18   SpO2 100%  Physical Exam Vitals and nursing note reviewed.  Constitutional:      General: She is not in acute distress.    Appearance: She is well-developed.  HENT:     Head: Normocephalic and atraumatic.  Eyes:     Conjunctiva/sclera: Conjunctivae normal.  Cardiovascular:     Rate and Rhythm: Normal rate and regular rhythm.     Heart sounds: No murmur heard. Pulmonary:     Effort: Pulmonary effort is normal. No respiratory distress.     Breath sounds: Normal breath sounds.  Abdominal:     Palpations: Abdomen is soft. There is mass.     Tenderness: There is abdominal tenderness.     Hernia: A hernia is present.     Comments: Bulge to the right side superior umbilicus area.  Measuring about 3 cm.  No erythema.  Is tender.  With pressure not able to reduce it.  No abdominal distention.  Musculoskeletal:        General: No swelling.     Cervical back: Neck supple.  Skin:    General: Skin is warm and dry.     Capillary Refill: Capillary refill takes less than 2 seconds.  Neurological:     General: No focal deficit present.  Mental Status: She is alert and oriented to person, place, and time.  Psychiatric:        Mood and Affect: Mood normal.     ED Results / Procedures / Treatments   Labs (all labs ordered are listed, but only abnormal results are displayed) Labs Reviewed  BASIC METABOLIC PANEL - Abnormal; Notable for the following components:      Result Value   Potassium 3.2 (*)    Calcium 8.7 (*)    All other components within normal limits  CBC WITH DIFFERENTIAL/PLATELET  PREGNANCY, URINE    EKG None  Radiology CT ABDOMEN PELVIS W CONTRAST Result Date: 06/18/2023 CLINICAL DATA:  Incarcerated umbilical hernia, bowel obstruction EXAM: CT ABDOMEN AND PELVIS WITH CONTRAST TECHNIQUE: Multidetector CT imaging of the abdomen and pelvis was performed using the standard protocol  following bolus administration of intravenous contrast. RADIATION DOSE REDUCTION: This exam was performed according to the departmental dose-optimization program which includes automated exposure control, adjustment of the mA and/or kV according to patient size and/or use of iterative reconstruction technique. CONTRAST:  85mL OMNIPAQUE IOHEXOL 300 MG/ML  SOLN COMPARISON:  None Available. FINDINGS: Lower chest: No acute abnormality. Hepatobiliary: No focal liver abnormality is seen. Status post cholecystectomy. No biliary dilatation. Pancreas: Unremarkable Spleen: Unremarkable Adrenals/Urinary Tract: Adrenal glands are unremarkable. Kidneys are normal, without renal calculi, focal lesion, or hydronephrosis. Bladder is unremarkable. Stomach/Bowel: Stomach is within normal limits. Appendix appears normal. No evidence of bowel wall thickening, distention, or inflammatory changes. Vascular/Lymphatic: No significant vascular findings are present. No enlarged abdominal or pelvic lymph nodes. Reproductive: A heterogeneously, briskly enhancing mass is seen within the right uterine body measuring roughly 7.8 x 6.8 x 12.6 cm in dimension in keeping with a dominant intramural uterine fibroid this displaces the endometrial cavity to the left. Intrauterine device in expected position within the displaced endometrial cavity. 2.7 and 3.1 cm simple cysts are seen within the right ovary and left ovary, respectively, likely representing follicular cysts for which no follow-up imaging is recommended. Other: Small fat containing umbilical hernia is present with extensive surrounding inflammatory stranding within the subcutaneous fat of the anterior abdominal wall in keeping with changes related to incarceration. Musculoskeletal: No acute or significant osseous findings. IMPRESSION: 1. Small fat containing umbilical hernia with extensive surrounding inflammatory stranding within the subcutaneous fat of the anterior abdominal wall in  keeping with changes related to incarceration. 2. No evidence of bowel obstruction. 3. Large, dominant intramural uterine fibroid measuring up to 12.6 cm in dimension. 4. Intrauterine device in expected position within the displaced endometrial cavity. Electronically Signed   By: Helyn Numbers M.D.   On: 06/18/2023 21:37    Procedures Procedures    Medications Ordered in ED Medications  HYDROmorphone (DILAUDID) injection 0.5 mg (0.5 mg Intravenous Given 06/18/23 1904)  ondansetron (ZOFRAN) injection 4 mg (4 mg Intravenous Given 06/18/23 1903)  iohexol (OMNIPAQUE) 300 MG/ML solution 100 mL (85 mLs Intravenous Contrast Given 06/18/23 2002)    ED Course/ Medical Decision Making/ A&P                                 Medical Decision Making Amount and/or Complexity of Data Reviewed Labs: ordered. Radiology: ordered.  Risk Prescription drug management.   Will get basic labs will give some IV pain medicine and some antinausea medicine.  Will get CT scan abdomen pelvis to evaluate this probable umbilical hernia.  To see what  is incarcerated in that area.   Much better.  CT scan does show evidence of fat entrapped in an umbilical hernia no evidence of any bowel entrapment.  This would explain why she is not having nausea or vomiting.  Patient will need to follow-up with general surgery.  Will give her who is on-call.  For follow-up but she does have a surgeon in mind I did say that she could contact that surgical group.  Patient's basic metabolic panel only significant for potassium 3.2 renal function was normal.  Her CBC white count was normal at 7.5 hemoglobin at 14 and platelets were normal at 263.   Final Clinical Impression(s) / ED Diagnoses Final diagnoses:  Umbilical hernia without obstruction and without gangrene    Rx / DC Orders ED Discharge Orders          Ordered    HYDROcodone-acetaminophen (NORCO/VICODIN) 5-325 MG tablet  Every 6 hours PRN        06/18/23 2221               Vanetta Mulders, MD 06/18/23 1857    Vanetta Mulders, MD 06/18/23 2221

## 2023-07-29 HISTORY — PX: HERNIA REPAIR: SHX51

## 2023-11-03 ENCOUNTER — Ambulatory Visit (AMBULATORY_SURGERY_CENTER)

## 2023-11-03 VITALS — Ht 71.0 in | Wt 155.0 lb

## 2023-11-03 DIAGNOSIS — Z1211 Encounter for screening for malignant neoplasm of colon: Secondary | ICD-10-CM

## 2023-11-03 MED ORDER — NA SULFATE-K SULFATE-MG SULF 17.5-3.13-1.6 GM/177ML PO SOLN: 1.0000 | Freq: Once | ORAL | 0 refills | Status: AC

## 2023-11-03 NOTE — Progress Notes (Signed)

## 2023-11-04 ENCOUNTER — Encounter: Payer: Self-pay | Admitting: Gastroenterology

## 2023-11-07 ENCOUNTER — Encounter (HOSPITAL_COMMUNITY): Payer: Self-pay

## 2023-11-13 ENCOUNTER — Telehealth: Payer: Self-pay | Admitting: Gastroenterology

## 2023-11-13 NOTE — Telephone Encounter (Signed)
 Patient notified. Thanked her for inquiring.

## 2023-11-13 NOTE — Telephone Encounter (Signed)
 PT is scheduled to have a colonoscopy on tomorrow. She wanted to let us  know that  Saturday she had seafood and ever since then she has had diarrhea to the point it has aggravated a hemorrhoid she has. She wants to know with the prep she starts tonight, is it a good idea to proceed. Please advise.

## 2023-11-13 NOTE — Telephone Encounter (Signed)
 Yes if her symptoms are improving, okay to proceed with colonoscopy

## 2023-11-13 NOTE — Telephone Encounter (Signed)
 Spoke with the patient. The diarrhea has improved some. She does continue to have diarrhea after each meal. She explains it is an urgent need to move her bowels after eating. She is treating the hemorrhoid and feels she has that under control. Okay to proceed with the colonoscopy?

## 2023-11-14 ENCOUNTER — Encounter: Payer: Self-pay | Admitting: Gastroenterology

## 2023-11-14 ENCOUNTER — Ambulatory Visit: Admitting: Gastroenterology

## 2023-11-14 VITALS — BP 123/74 | HR 78 | Temp 98.8°F | Resp 13 | Ht 71.0 in | Wt 155.0 lb

## 2023-11-14 DIAGNOSIS — K5730 Diverticulosis of large intestine without perforation or abscess without bleeding: Secondary | ICD-10-CM | POA: Diagnosis not present

## 2023-11-14 DIAGNOSIS — D120 Benign neoplasm of cecum: Secondary | ICD-10-CM | POA: Diagnosis not present

## 2023-11-14 DIAGNOSIS — K644 Residual hemorrhoidal skin tags: Secondary | ICD-10-CM | POA: Diagnosis not present

## 2023-11-14 DIAGNOSIS — D125 Benign neoplasm of sigmoid colon: Secondary | ICD-10-CM

## 2023-11-14 DIAGNOSIS — K648 Other hemorrhoids: Secondary | ICD-10-CM | POA: Diagnosis not present

## 2023-11-14 DIAGNOSIS — Z1211 Encounter for screening for malignant neoplasm of colon: Secondary | ICD-10-CM | POA: Diagnosis present

## 2023-11-14 MED ORDER — SODIUM CHLORIDE 0.9 % IV SOLN: 500.0000 mL | Freq: Once | INTRAVENOUS | Status: DC

## 2023-11-14 NOTE — Progress Notes (Signed)
 [facility] [facility] History and Physical   Primary Care Physician:  Jodi Munroe   Reason for Procedure:  Colorectal cancer screening  Plan:    Screening colonoscopy with possible interventions as needed     HPI: Laura [name] is a very pleasant 48 y.o. female here for screening colonoscopy. Denies any nausea, vomiting, abdominal pain, melena or bright red blood per rectum  The risks and benefits as well as alternatives of endoscopic procedure(s) have been discussed and reviewed. All questions answered. The patient agrees to proceed.    History reviewed. No pertinent past medical history.  Past Surgical History:  Procedure Laterality Date   CHOLECYSTECTOMY     HERNIA REPAIR  07/29/2023   TONSILLECTOMY  2004    Prior to Admission medications   Medication Sig Start Date End Date Taking? Authorizing Provider  ALPRAZolam (XANAX) 1 MG tablet Take 1 mg by mouth daily as needed.   Yes [provider]  amphetamine-dextroamphetamine (ADDERALL XR) 30 MG 24 hr capsule Take 30 mg by mouth daily.   Yes [provider]  levonorgestrel (MIRENA, 52 MG,) 20 MCG/DAY IUD 1 each by Intrauterine route once. 10/21/17  Yes [provider]  valACYclovir (VALTREX) 1000 MG tablet Take 1,000 mg by mouth. PRN per pt    [provider]    Current Outpatient Medications  Medication Sig Dispense Refill   ALPRAZolam (XANAX) 1 MG tablet Take 1 mg by mouth daily as needed.     amphetamine-dextroamphetamine (ADDERALL XR) 30 MG 24 hr capsule Take 30 mg by mouth daily.     levonorgestrel (MIRENA, 52 MG,) 20 MCG/DAY IUD 1 each by Intrauterine route once.     valACYclovir (VALTREX) 1000 MG tablet Take 1,000 mg by mouth. PRN per pt     Current Facility-Administered Medications  Medication Dose Route Frequency Provider Last Rate Last Admin   0.9 %  sodium chloride infusion  500 mL Intravenous Once Hattye Siegfried V, MD        Allergies as of 11/14/2023 -  Review Complete 11/03/2023  Allergen Reaction Noted   Penicillins Other (See Comments) 06/18/2023    Family History  Problem Relation Age of Onset   Colon cancer Neg Hx    Esophageal cancer Neg Hx    Rectal cancer Neg Hx    Stomach cancer Neg Hx     Social History   Socioeconomic History   Marital status: Married    Spouse name: Not on file   Number of children: Not on file   Years of education: Not on file   Highest education level: Not on file  Occupational History   Not on file  Tobacco Use   Smoking status: Never   Smokeless tobacco: Never  Vaping Use   Vaping status: Never Used  Substance and Sexual Activity   Alcohol use: Yes    Comment: occasionally- once every 6 months   Drug use: Never   Sexual activity: Not on file  Other Topics Concern   Not on file  Social History Narrative   Not on file   Social Drivers of Health   Financial Resource Strain: Low Risk  (09/17/2023)   Received from [facility]   Overall Financial Resource Strain (CARDIA)    Difficulty of Paying Living Expenses: Not hard at all  Food Insecurity: No Food Insecurity (09/17/2023)   Received from [facility]   Hunger Vital Sign    Worried About Running Out of Food in the Last Year:  Never true    Ran Out of Food in the Last Year: Never true  Transportation Needs: No Transportation Needs (09/17/2023)   Received from [facility]   PRAPARE - Transportation    Lack of Transportation (Medical): No    Lack of Transportation (Non-Medical): No  Physical Activity: Sufficiently Active (09/17/2023)   Received from [facility]   Exercise Vital Sign    Days of Exercise per Week: 4 days    Minutes of Exercise per Session: 60 min  Stress: Stress Concern Present (09/17/2023)   Received from [facility] - Occupational Stress Questionnaire    Feeling of Stress : To some extent  Social Connections: Socially Integrated (09/17/2023)   Received from [facility]   Social Network    How would you rate your social network (family, work, friends)?: Good participation with social networks  Intimate Partner Violence: Not At Risk (09/17/2023)   Received from [facility]   HITS    Over the last 12 months how often did your partner physically hurt you?: Never    Over the last 12 months how often did your partner insult you or talk down to you?: Never    Over the last 12 months how often did your partner threaten you with physical harm?: Never    Over the last 12 months how often did your partner scream or curse at you?: Never    Review of Systems:  All other review of systems negative except as mentioned in the HPI.  Physical Exam: Vital signs in last 24 hours: BP 126/82   Pulse 97   Temp 98.8 F (37.1 C)   Ht 5\' 11"  (1.803 m)   Wt 155 lb (70.3 kg)   SpO2 100%   BMI 21.62 kg/m  General:   Alert, NAD Lungs:  Clear .   Heart:  Regular rate and rhythm Abdomen:  Soft, nontender and nondistended. Neuro/Psych:  Alert and cooperative. Normal mood and affect. A and O x 3  Reviewed labs, radiology imaging, old records and pertinent past GI work up  Patient is appropriate for planned procedure(s) and anesthesia in an ambulatory setting   K. Veena Amica Harron , MD [phone]

## 2023-11-14 NOTE — Progress Notes (Signed)
 To pacu, VSS. Report to [email or web address].tb

## 2023-11-14 NOTE — Progress Notes (Signed)
 Called to room to assist during endoscopic procedure.  Patient ID and intended procedure confirmed with present staff. Received instructions for my participation in the procedure from the performing physician.

## 2023-11-14 NOTE — Patient Instructions (Addendum)
 Resume previous diet Continue present medications Await pathology results Repeat colonoscopy in 3 years for surveillance  .l   YOU HAD AN ENDOSCOPIC PROCEDURE TODAY AT THE [facility]:   Refer to the procedure report that was given to you for any specific questions about what was found during the examination.  If the procedure report does not answer your questions, please call your gastroenterologist to clarify.  If you requested that your care partner not be given the details of your procedure findings, then the procedure report has been included in a sealed envelope for you to review at your convenience later.  YOU SHOULD EXPECT: Some feelings of bloating in the abdomen. Passage of more gas than usual.  Walking can help get rid of the air that was put into your GI tract during the procedure and reduce the bloating. If you had a lower endoscopy (such as a colonoscopy or flexible sigmoidoscopy) you may notice spotting of blood in your stool or on the toilet paper. If you underwent a bowel prep for your procedure, you may not have a normal bowel movement for a few days.  Please Note:  You might notice some irritation and congestion in your nose or some drainage.  This is from the oxygen used during your procedure.  There is no need for concern and it should clear up in a day or so.  SYMPTOMS TO REPORT IMMEDIATELY:  Following lower endoscopy (colonoscopy or flexible sigmoidoscopy):  Excessive amounts of blood in the stool  Significant tenderness or worsening of abdominal pains  Swelling of the abdomen that is new, acute  Fever of 100F or higher   For urgent or emergent issues, a gastroenterologist can be reached at any hour by calling [phone]. Do not use MyChart messaging for urgent concerns.    DIET:  We do recommend a small meal at first, but then you may proceed to your regular diet.  Drink plenty of fluids but you should avoid alcoholic beverages for 24  hours.  ACTIVITY:  You should plan to take it easy for the rest of today and you should NOT DRIVE or use heavy machinery until tomorrow (because of the sedation medicines used during the test).    FOLLOW UP: Our staff will call the number listed on your records the next business day following your procedure.  We will call around 7:15- 8:00 am to check on you and address any questions or concerns that you may have regarding the information given to you following your procedure. If we do not reach you, we will leave a message.     If any biopsies were taken you will be contacted by phone or by letter within the next 1-3 weeks.  Please call us  at [phone] if you have not heard about the biopsies in 3 weeks.    SIGNATURES/CONFIDENTIALITY: You and/or your care partner have signed paperwork which will be entered into your electronic medical record.  These signatures attest to the fact that that the information above on your After Visit Summary has been reviewed and is understood.  Full responsibility of the confidentiality of this discharge information lies with you and/or your care-partner.

## 2023-11-14 NOTE — Op Note (Signed)
 [facility] Patient Name: Laura [name] Procedure Date: 11/14/2023 9:23 AM MRN: [id number] Endoscopist: Sergio Dandy , MD, [id number] Age: 48 Referring MD:  Date of Birth: 28-Nov-1975 Gender: Female Account #: [id number] Procedure:                Colonoscopy Indications:              Screening for colorectal malignant neoplasm Medicines:                Monitored Anesthesia Care Procedure:                Pre-Anesthesia Assessment:                           - Prior to the procedure, a History and Physical                            was performed, and patient medications and                            allergies were reviewed. The patient's tolerance of                            previous anesthesia was also reviewed. The risks                            and benefits of the procedure and the sedation                            options and risks were discussed with the patient.                            All questions were answered, and informed consent                            was obtained. Prior Anticoagulants: The patient has                            taken no anticoagulant or antiplatelet agents. ASA                            Grade Assessment: II - A patient with mild systemic                            disease. After reviewing the risks and benefits,                            the patient was deemed in satisfactory condition to                            undergo the procedure.                           After obtaining informed consent, the colonoscope  was passed under direct vision. Throughout the                            procedure, the patient's blood pressure, pulse, and                            oxygen saturations were monitored continuously. The                            [id number]-HQ190L Colonoscope [id number] was introduced                            through the anus and advanced to the the cecum,                            identified by  appendiceal orifice and ileocecal                            valve. The colonoscopy was performed without                            difficulty. The patient tolerated the procedure                            well. The quality of the bowel preparation was                            good. The ileocecal valve, appendiceal orifice, and                            rectum were photographed. Scope In: 9:40:23 AM Scope Out: 10:02:41 AM Scope Withdrawal Time: 0 hours 17 minutes 36 seconds  Total Procedure Duration: 0 hours 22 minutes 18 seconds  Findings:                 The perianal and digital rectal examinations were                            normal.                           Three sessile polyps were found in the sigmoid                            colon and cecum. The polyps were 3 to 14 mm in                            size. These polyps were removed with a cold snare.                            Resection and retrieval were complete.                           A few small-mouthed diverticula were found in the  sigmoid colon and descending colon.                           Non-bleeding external and internal hemorrhoids were                            found during retroflexion. The hemorrhoids were                            small. Complications:            No immediate complications. Estimated Blood Loss:     Estimated blood loss was minimal. Impression:               - Three 3 to 14 mm polyps in the sigmoid colon and                            in the cecum, removed with a cold snare. Resected                            and retrieved.                           - Diverticulosis in the sigmoid colon and in the                            descending colon.                           - Non-bleeding external and internal hemorrhoids. Recommendation:           - Patient has a contact number available for                            emergencies. The signs and symptoms of  potential                            delayed complications were discussed with the                            patient. Return to normal activities tomorrow.                            Written discharge instructions were provided to the                            patient.                           - Resume previous diet.                           - Continue present medications.                           - Await pathology results.                           -  Repeat colonoscopy in 3 years for surveillance                            based on pathology results. Bhavana Kady V. Rosibel Giacobbe, MD 11/14/2023 10:08:13 AM This report has been signed electronically.

## 2023-11-17 ENCOUNTER — Telehealth: Payer: Self-pay | Admitting: Lactation Services

## 2023-11-17 ENCOUNTER — Encounter: Payer: Self-pay | Admitting: Gastroenterology

## 2023-11-17 NOTE — Telephone Encounter (Signed)
 No answer left voice mail

## 2023-11-18 LAB — SURGICAL PATHOLOGY

## 2023-12-17 ENCOUNTER — Ambulatory Visit: Payer: Self-pay | Admitting: Gastroenterology
